# Patient Record
Sex: Female | Born: 2000 | Race: Black or African American | Hispanic: No | Marital: Single | Smoking: Never smoker
Health system: Southern US, Community
[De-identification: ages and names within clinical notes are randomized; demographics above are authoritative.]

---
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
## 2020-04-07 ENCOUNTER — Encounter: Payer: Self-pay | Admitting: Emergency Medicine

## 2020-04-07 ENCOUNTER — Other Ambulatory Visit: Payer: Self-pay

## 2020-04-07 ENCOUNTER — Emergency Department
Admission: EM | Admit: 2020-04-07 | Discharge: 2020-04-07 | Disposition: A | Discharge status: Home / Self Care | Payer: No Typology Code available for payment source | Attending: Emergency Medicine | Admitting: Emergency Medicine

## 2020-04-07 ENCOUNTER — Emergency Department: Payer: No Typology Code available for payment source

## 2020-04-07 DIAGNOSIS — R079 Chest pain, unspecified: Secondary | ICD-10-CM | POA: Diagnosis not present

## 2020-04-07 LAB — POCT PREGNANCY, URINE: Preg Test, Ur: NEGATIVE

## 2020-04-07 LAB — CBC
HCT: 40.7 % (ref 36.0–46.0)
Hemoglobin: 13.4 g/dL (ref 12.0–15.0)
MCH: 30.9 pg (ref 26.0–34.0)
MCHC: 32.9 g/dL (ref 30.0–36.0)
MCV: 93.8 fL (ref 80.0–100.0)
Platelets: 433 10*3/uL — ABNORMAL HIGH (ref 150–400)
RBC: 4.34 MIL/uL (ref 3.87–5.11)
RDW: 12.5 % (ref 11.5–15.5)
WBC: 6.4 10*3/uL (ref 4.0–10.5)
nRBC: 0 % (ref 0.0–0.2)

## 2020-04-07 LAB — BASIC METABOLIC PANEL
Anion gap: 8 (ref 5–15)
BUN: 8 mg/dL (ref 6–20)
CO2: 27 mmol/L (ref 22–32)
Calcium: 9.4 mg/dL (ref 8.9–10.3)
Chloride: 105 mmol/L (ref 98–111)
Creatinine, Ser: 0.59 mg/dL (ref 0.44–1.00)
GFR calc Af Amer: >60 mL/min (ref >60)
GFR calc non Af Amer: >60 mL/min (ref >60)
Glucose, Bld: 99 mg/dL (ref 70–99)
Potassium: 3.9 mmol/L (ref 3.5–5.1)
Sodium: 140 mmol/L (ref 135–145)

## 2020-04-07 LAB — TROPONIN I (HIGH SENSITIVITY): Troponin I (High Sensitivity): <2 ng/L (ref <18)

## 2020-04-07 MED ORDER — SODIUM CHLORIDE 0.9% FLUSH: 3.0000 mL | Freq: Once | INTRAVENOUS | Status: DC

## 2020-04-07 NOTE — ED Provider Notes (Signed)
[facility] Emergency Department Provider Note  Time seen: 10:04 AM  I have reviewed the triage vital signs and the nursing notes.   HISTORY  Chief Complaint Chest Pain   HPI Monica Hester is a 19 y.o. female with no past medical history presents to the emergency department for chest discomfort.  According to the patient on Thursday she lifted a heavy case of water and felt some chest discomfort.  States since then she has intermittently been experiencing pain in the chest.  Denies any shortness of breath or cough.  No chest pain currently.  No abdominal pain.  Largely negative review of systems.  Describes the pain as mild pulling type pain when it does occur.   History reviewed. No pertinent past medical history.  There are no problems to display for this patient.   History reviewed. No pertinent surgical history.  Prior to Admission medications   Medication Sig Start Date End Date Taking? Authorizing Provider  amoxicillin (AMOXIL) 875 MG tablet Take 1 tablet (875 mg total) by mouth 2 (two) times daily. Patient not taking: Reported on 09/04/2019 05/16/18   Faythe Ghee, PA-C    No Known Allergies  No family history on file.  Social History Social History   Tobacco Use  . Smoking status: Never Smoker  . Smokeless tobacco: Never Used  Substance Use Topics  . Alcohol use: No  . Drug use: No    Review of Systems Constitutional: Negative for fever. Cardiovascular: Intermittent chest pain over the past 3 days. Respiratory: Negative for shortness of breath.  Negative for cough. Gastrointestinal: Negative for abdominal pain Musculoskeletal: Negative for musculoskeletal complaints Neurological: Negative for headache All other ROS negative  ____________________________________________   PHYSICAL EXAM:  VITAL SIGNS: ED Triage Vitals  Enc Vitals Group     BP 04/07/20 0915 137/78     Pulse Rate 04/07/20 0915 90     Resp 04/07/20 0915 16    Temp 04/07/20 0915 98 F (36.7 C)     Temp Source 04/07/20 0915 Oral     SpO2 04/07/20 0915 99 %     Weight 04/07/20 0916 150 lb (68 kg)     Height 04/07/20 0916 5\' 5"  (1.651 m)     Head Circumference --      Peak Flow --      Pain Score 04/07/20 0916 5     Pain Loc --      Pain Edu? --      Excl. in GC? --    Constitutional: Alert and oriented. Well appearing and in no distress. Eyes: Normal exam ENT      Head: Normocephalic and atraumatic.      Mouth/Throat: Mucous membranes are moist. Cardiovascular: Normal rate, regular rhythm. No murmur Respiratory: Normal respiratory effort without tachypnea nor retractions. Breath sounds are clear Gastrointestinal: Soft and nontender. No distention.  Musculoskeletal: Nontender with normal range of motion in all extremities.  Neurologic:  Normal speech and language. No gross focal neurologic deficits Skin:  Skin is warm, dry and intact.  Psychiatric: Mood and affect are normal.  ____________________________________________    EKG  EKG viewed and interpreted by myself shows a normal sinus rhythm 85 bpm with a narrow QRS, normal axis, normal intervals, slight diffuse ST elevation most consistent with early repolarization.  No reciprocal depressions.  No concerning ST elevation.  ____________________________________________    RADIOLOGY  Chest x-ray negative  ____________________________________________   INITIAL IMPRESSION / ASSESSMENT AND PLAN / ED  COURSE  Pertinent labs & imaging results that were available during my care of the patient were reviewed by me and considered in my medical decision making (see chart for details).   Patient presents to the emergency department for chest pain intermittent over the past 3 days which occurred after lifting a case of water.  Highly suspect musculoskeletal pain/strain.  Patient's EKG is reassuring, chest x-ray on my evaluation appears clear awaiting radiology read.  We will check labs  including cardiac enzymes.  Overall patient appears well with a reassuring exam and reassuring vitals.  X-ray negative.  Labs are reassuring including negative troponin.  We will discharge patient with PCP follow-up.  Monica Hester was evaluated in Emergency Department on 04/07/2020 for the symptoms described in the history of present illness. She was evaluated in the context of the global COVID-19 pandemic, which necessitated consideration that the patient might be at risk for infection with the SARS-CoV-2 virus that causes COVID-19. Institutional protocols and algorithms that pertain to the evaluation of patients at risk for COVID-19 are in a state of rapid change based on information released by regulatory bodies including the CDC and federal and state organizations. These policies and algorithms were followed during the patient's care in the ED.  ____________________________________________   FINAL CLINICAL IMPRESSION(S) / ED DIAGNOSES  Chest pain   Harvest Dark, MD 04/07/20 1021

## 2020-04-07 NOTE — ED Triage Notes (Signed)
Pt to ED via POV c/o chest pressure since Thursday night. Pt states that earlier on Thursday she was carrying a heavy case of waters up the stairs. Pt reports pressure started later than night. Pt denies any other symptoms. Pt states that pain is worse when she moves a certain way or when she yawns. Pt is in NAD at this time.

## 2020-04-21 ENCOUNTER — Emergency Department
Admission: EM | Admit: 2020-04-21 | Discharge: 2020-04-21 | Disposition: A | Discharge status: Home / Self Care | Payer: No Typology Code available for payment source | Attending: Emergency Medicine | Admitting: Emergency Medicine

## 2020-04-21 ENCOUNTER — Encounter: Payer: Self-pay | Admitting: Emergency Medicine

## 2020-04-21 ENCOUNTER — Emergency Department: Payer: No Typology Code available for payment source

## 2020-04-21 ENCOUNTER — Other Ambulatory Visit: Payer: Self-pay

## 2020-04-21 DIAGNOSIS — W010XXA Fall on same level from slipping, tripping and stumbling without subsequent striking against object, initial encounter: Secondary | ICD-10-CM | POA: Diagnosis not present

## 2020-04-21 DIAGNOSIS — S80212A Abrasion, left knee, initial encounter: Secondary | ICD-10-CM | POA: Diagnosis not present

## 2020-04-21 DIAGNOSIS — Y929 Unspecified place or not applicable: Secondary | ICD-10-CM | POA: Insufficient documentation

## 2020-04-21 DIAGNOSIS — Y939 Activity, unspecified: Secondary | ICD-10-CM | POA: Diagnosis not present

## 2020-04-21 DIAGNOSIS — Y999 Unspecified external cause status: Secondary | ICD-10-CM | POA: Insufficient documentation

## 2020-04-21 DIAGNOSIS — S8002XA Contusion of left knee, initial encounter: Secondary | ICD-10-CM

## 2020-04-21 DIAGNOSIS — Z23 Encounter for immunization: Secondary | ICD-10-CM | POA: Diagnosis not present

## 2020-04-21 DIAGNOSIS — W19XXXA Unspecified fall, initial encounter: Secondary | ICD-10-CM

## 2020-04-21 MED ORDER — TETANUS-DIPHTH-ACELL PERTUSSIS 5-2.5-18.5 LF-MCG/0.5 IM SUSP
0.5000 mL | Freq: Once | INTRAMUSCULAR | Status: AC
  Administered 2020-04-21: 09:00:00 0.5 mL via INTRAMUSCULAR
  Filled 2020-04-21: qty 0.5

## 2020-04-21 NOTE — Discharge Instructions (Signed)
Follow-up with your primary care provider if any continued problems or concerns.  Tylenol or ibuprofen as needed for pain.  Ice and elevate to reduce any swelling and help with pain control.  Clean area daily with mild soap and water and watch for any signs of infection.

## 2020-04-21 NOTE — ED Provider Notes (Signed)
[facility] Emergency Department Provider Note   ____________________________________________   First MD Initiated Contact with Patient 04/21/20 [phone]     (approximate)  I have reviewed the triage vital signs and the nursing notes.   HISTORY  Chief Complaint Fall   HPI Monica Hester is a 19 y.o. female presents to the ED with complaint of left knee pain.  Patient fell on concrete last evening and has an abrasion to her knee.  Patient denies any head injury or loss of consciousness.  Her pain as 6 out of 10.     History reviewed. No pertinent past medical history.  There are no problems to display for this patient.   History reviewed. No pertinent surgical history.  Prior to Admission medications   Not on File    Allergies Patient has no known allergies.  History reviewed. No pertinent family history.  Social History Social History   Tobacco Use  . Smoking status: Never Smoker  . Smokeless tobacco: Never Used  Substance Use Topics  . Alcohol use: No  . Drug use: No    Review of Systems Constitutional: No fever/chills Eyes: No visual changes. ENT: No trauma. Cardiovascular: Denies chest pain. Respiratory: Denies shortness of breath. Gastrointestinal:   No nausea, no vomiting.  Musculoskeletal: Positive left knee pain. Skin: Positive abrasion. Neurological: Negative for headaches, focal weakness or numbness. ____________________________________________   PHYSICAL EXAM:  VITAL SIGNS: ED Triage Vitals  Enc Vitals Group     BP 04/21/20 0746 (!) 135/100     Pulse Rate 04/21/20 0746 91     Resp 04/21/20 0746 18     Temp 04/21/20 0746 98.4 F (36.9 C)     Temp Source 04/21/20 0746 Oral     SpO2 04/21/20 0746 96 %     Weight 04/21/20 0742 145 lb (65.8 kg)     Height 04/21/20 0742 5\' 5"  (1.651 m)     Head Circumference --      Peak Flow --      Pain Score 04/21/20 0741 6     Pain Loc --      Pain Edu? --      Excl. in GC?  --     Constitutional: Alert and oriented. Well appearing and in no acute distress. Eyes: Conjunctivae are normal.  Head: Atraumatic. Nose: No trauma. Neck: No stridor.   Cardiovascular: Normal rate, regular rhythm. Grossly normal heart sounds.  Good peripheral circulation. Respiratory: Normal respiratory effort.  No retractions. Lungs CTAB. Musculoskeletal: No gross deformities noted of the left knee.  There is soft tissue abrasion noted anteriorly without active bleeding or noted foreign body. Neurologic:  Normal speech and language. No gross focal neurologic deficits are appreciated.  Skin:  Skin is warm, dry.  Abrasion as noted above. Psychiatric: Mood and affect are normal. Speech and behavior are normal.  ____________________________________________   LABS (all labs ordered are listed, but only abnormal results are displayed)  Labs Reviewed - No data to display  RADIOLOGY  Official radiology report(s): DG Knee Complete 4 Views Left  Result Date: 04/21/2020 CLINICAL DATA:  Fall EXAM: LEFT KNEE - COMPLETE 4+ VIEW COMPARISON:  None. FINDINGS: Alignment is anatomic. No acute fracture. No joint effusion. Joint spaces are preserved. IMPRESSION: Negative. Electronically Signed   By: 06/21/2020 M.D.   On: 04/21/2020 08:51    ____________________________________________   PROCEDURES  Procedure(s) performed (including Critical Care):  Procedures  Area was cleaned and dressed by 06/21/2020,  RN. ____________________________________________   INITIAL IMPRESSION / ASSESSMENT AND PLAN / ED COURSE  As part of my medical decision making, I reviewed the following data within the electronic MEDICAL RECORD NUMBER Notes from prior ED visits and [place] Controlled Substance Database  KATHIA COVINGTON was evaluated in Emergency Department on 04/21/2020 for the symptoms described in the history of present illness. She was evaluated in the context of the global COVID-19 pandemic, which  necessitated consideration that the patient might be at risk for infection with the SARS-CoV-2 virus that causes COVID-19. Institutional protocols and algorithms that pertain to the evaluation of patients at risk for COVID-19 are in a state of rapid change based on information released by regulatory bodies including the CDC and federal and state organizations. These policies and algorithms were followed during the patient's care in the ED.  19 year old female was brought to the ED by mother with complaint of injury to her left leg last evening when she tripped and fell on concrete.  Tetanus was updated.  X-rays were negative for acute bony injury.  Patient was made aware that this will be sore and stiff.  She is to clean daily with mild soap and water and watch for any signs of infection.  Mother is aware that she may need Tylenol or ibuprofen as needed for pain.  Ice and elevation to reduce swelling and help with pain.  She is to follow-up with her PCP if any continued problems.  ____________________________________________   FINAL CLINICAL IMPRESSION(S) / ED DIAGNOSES  Final diagnoses:  Contusion of left knee, initial encounter  Abrasion, left knee, initial encounter  Fall, initial encounter     ED Discharge Orders    None       Note:  This document was prepared using Dragon voice recognition software and may include unintentional dictation errors.    Johnn Hai, PA-C 04/21/20 1605    Lavonia Drafts, MD 04/25/20 [phone]

## 2020-04-21 NOTE — ED Notes (Signed)
See triage note  Presents s/p trip and fall   Abrasion noted to left knee

## 2020-04-21 NOTE — ED Triage Notes (Signed)
Pt fell last night and cut left knee. Ambulatory. Band-Aid over knee. NAD

## 2020-05-04 ENCOUNTER — Encounter: Payer: Self-pay | Admitting: Emergency Medicine

## 2020-05-04 ENCOUNTER — Emergency Department
Admission: EM | Admit: 2020-05-04 | Discharge: 2020-05-04 | Disposition: A | Discharge status: Home / Self Care | Payer: No Typology Code available for payment source | Attending: Emergency Medicine | Admitting: Emergency Medicine

## 2020-05-04 ENCOUNTER — Other Ambulatory Visit: Payer: Self-pay

## 2020-05-04 DIAGNOSIS — W19XXXD Unspecified fall, subsequent encounter: Secondary | ICD-10-CM | POA: Insufficient documentation

## 2020-05-04 DIAGNOSIS — L089 Local infection of the skin and subcutaneous tissue, unspecified: Secondary | ICD-10-CM | POA: Diagnosis not present

## 2020-05-04 DIAGNOSIS — S81002D Unspecified open wound, left knee, subsequent encounter: Secondary | ICD-10-CM | POA: Diagnosis not present

## 2020-05-04 DIAGNOSIS — M25562 Pain in left knee: Secondary | ICD-10-CM | POA: Diagnosis present

## 2020-05-04 MED ORDER — CEPHALEXIN 500 MG PO CAPS: 500.0000 mg | ORAL_CAPSULE | Freq: Three times a day (TID) | ORAL | 0 refills | Status: DC

## 2020-05-04 NOTE — Discharge Instructions (Addendum)
Clean the area with soap and water only.  Do not use hydrogen peroxide on the wound.  You may apply a small amount of Neosporin daily if desired.  Take the antibiotic as prescribed.  If the infection is worsening please return emergency department.

## 2020-05-04 NOTE — ED Triage Notes (Signed)
Pt arrived via POV was seen 5/2 for knee abrasion, concerned that it is infected at this time, open area present Pt states she has been applying neosporin and spray peroxide to area as well.

## 2020-05-04 NOTE — ED Provider Notes (Signed)
[facility] Emergency Department Provider Note  ____________________________________________   First MD Initiated Contact with Patient 05/04/20 1123     (approximate)  I have reviewed the triage vital signs and the nursing notes.   HISTORY  Chief Complaint Knee Pain    HPI MANAL KREUTZER is a 19 y.o. female presents emergency department with mother.  Patient states she had a fall about 2 weeks ago and had blisters on the knee from the fall.  Area appears to be getting infected.  Mother is concerned that it can get worse.  She denies any fever or chills.  No pain at the site.  They have been using hydrogen peroxide to clean the area    History reviewed. No pertinent past medical history.  There are no problems to display for this patient.   History reviewed. No pertinent surgical history.  Prior to Admission medications   Medication Sig Start Date End Date Taking? Authorizing Provider  cephALEXin (KEFLEX) 500 MG capsule Take 1 capsule (500 mg total) by mouth 3 (three) times daily. 05/04/20   Versie Starks, PA-C    Allergies Patient has no known allergies.  History reviewed. No pertinent family history.  Social History Social History   Tobacco Use  . Smoking status: Never Smoker  . Smokeless tobacco: Never Used  Substance Use Topics  . Alcohol use: No  . Drug use: No    Review of Systems  Constitutional: No fever/chills Eyes: No visual changes. ENT: No sore throat. Respiratory: Denies cough Cardiovascular: Denies chest pain Gastrointestinal: Denies abdominal pain Genitourinary: Negative for dysuria. Musculoskeletal: Negative for back pain. Skin: Negative for rash.  Positive for open wound to the left knee Psychiatric: no mood changes,     ____________________________________________   PHYSICAL EXAM:  VITAL SIGNS: ED Triage Vitals  Enc Vitals Group     BP 05/04/20 1111 123/80     Pulse Rate 05/04/20 1111 86     Resp  05/04/20 1111 16     Temp 05/04/20 1111 98.3 F (36.8 C)     Temp Source 05/04/20 1111 Oral     SpO2 05/04/20 1111 100 %     Weight 05/04/20 1117 145 lb (65.8 kg)     Height 05/04/20 1117 5\' 5"  (1.651 m)     Head Circumference --      Peak Flow --      Pain Score 05/04/20 1117 4     Pain Loc --      Pain Edu? --      Excl. in [place]? --     Constitutional: Alert and oriented. Well appearing and in no acute distress. Eyes: Conjunctivae are normal.  Head: Atraumatic. Nose: No congestion/rhinnorhea. Mouth/Throat: Mucous membranes are moist.   Neck:  supple no lymphadenopathy noted Cardiovascular: Normal rate, regular rhythm. Respiratory: Normal respiratory effort.  No retractions,  GU: deferred Musculoskeletal: FROM all extremities, warm and well perfused, open wound noted to be partially scabbed over on the left knee, large fluid-filled blister noted inferior to the wound.  Area is slightly red and tender to touch Neurologic:  Normal speech and language.  Skin:  Skin is warm, dry and intact.  Redness noted below the left knee Psychiatric: Mood and affect are normal. Speech and behavior are normal.  ____________________________________________   LABS (all labs ordered are listed, but only abnormal results are displayed)  Labs Reviewed - No data to display ____________________________________________   ____________________________________________  RADIOLOGY    ____________________________________________  PROCEDURES  Procedure(s) performed: No  Procedures    ____________________________________________   INITIAL IMPRESSION / ASSESSMENT AND PLAN / ED COURSE  Pertinent labs & imaging results that were available during my care of the patient were reviewed by me and considered in my medical decision making (see chart for details).   Patient is a 19 year old female presents emergency department with mother with concerns of infection to the wound on her left knee.   See HPI.  Symptoms started a few days ago.  The injury happened 2 weeks ago.  Physical exam shows the knee to be nontender.  The wound below it is slightly tender.  Area has started to scab but there is also a fluid-filled blister inferior to the wound.  Area is very tender to palpation.  Wound culture obtained  I did explain the findings to the mother and the patient.  She was given a prescription for Keflex 500 3 times daily.  Follow-up with your regular doctor if not improving in 3 days.  Return emergency department worsening.  Also explained to them to stop using hydrogen peroxide on the open wound as this is eating white scar tissue.  They are to only use soap and water.  SHe states she understands will comply.  Child was discharged in stable condition.    ARIEONA SWAGGERTY was evaluated in Emergency Department on 05/04/2020 for the symptoms described in the history of present illness. She was evaluated in the context of the global COVID-19 pandemic, which necessitated consideration that the patient might be at risk for infection with the SARS-CoV-2 virus that causes COVID-19. Institutional protocols and algorithms that pertain to the evaluation of patients at risk for COVID-19 are in a state of rapid change based on information released by regulatory bodies including the CDC and federal and state organizations. These policies and algorithms were followed during the patient's care in the ED.   As part of my medical decision making, I reviewed the following data within the electronic MEDICAL RECORD NUMBER History obtained from family, Nursing notes reviewed and incorporated, Old chart reviewed, Notes from prior ED visits and [place] Controlled Substance Database  ____________________________________________   FINAL CLINICAL IMPRESSION(S) / ED DIAGNOSES  Final diagnoses:  Wound infection      NEW MEDICATIONS STARTED DURING THIS VISIT:  New Prescriptions   CEPHALEXIN (KEFLEX) 500 MG CAPSULE    Take  1 capsule (500 mg total) by mouth 3 (three) times daily.     Note:  This document was prepared using Dragon voice recognition software and may include unintentional dictation errors.    Faythe Ghee, PA-C 05/04/20 1143    Jene Every, MD 05/04/20 1144

## 2020-05-04 NOTE — ED Notes (Signed)
laft knee pain, reports s/p fall 2 weeks ago and had 2 blisters on her knee from fall. One erupted on its own. Other still there and now painful. Denies fever. Awaiting eval and plan of care.

## 2020-05-06 LAB — AEROBIC CULTURE W GRAM STAIN (SUPERFICIAL SPECIMEN): Gram Stain: NONE SEEN

## 2020-07-11 IMAGING — DX DG KNEE COMPLETE 4+V*L*
4 series · 4 of 4 positions shown · non-contrast
Comparison: None.

CLINICAL DATA: Fall

EXAM:
LEFT KNEE - COMPLETE 4+ VIEW

[knee ap]
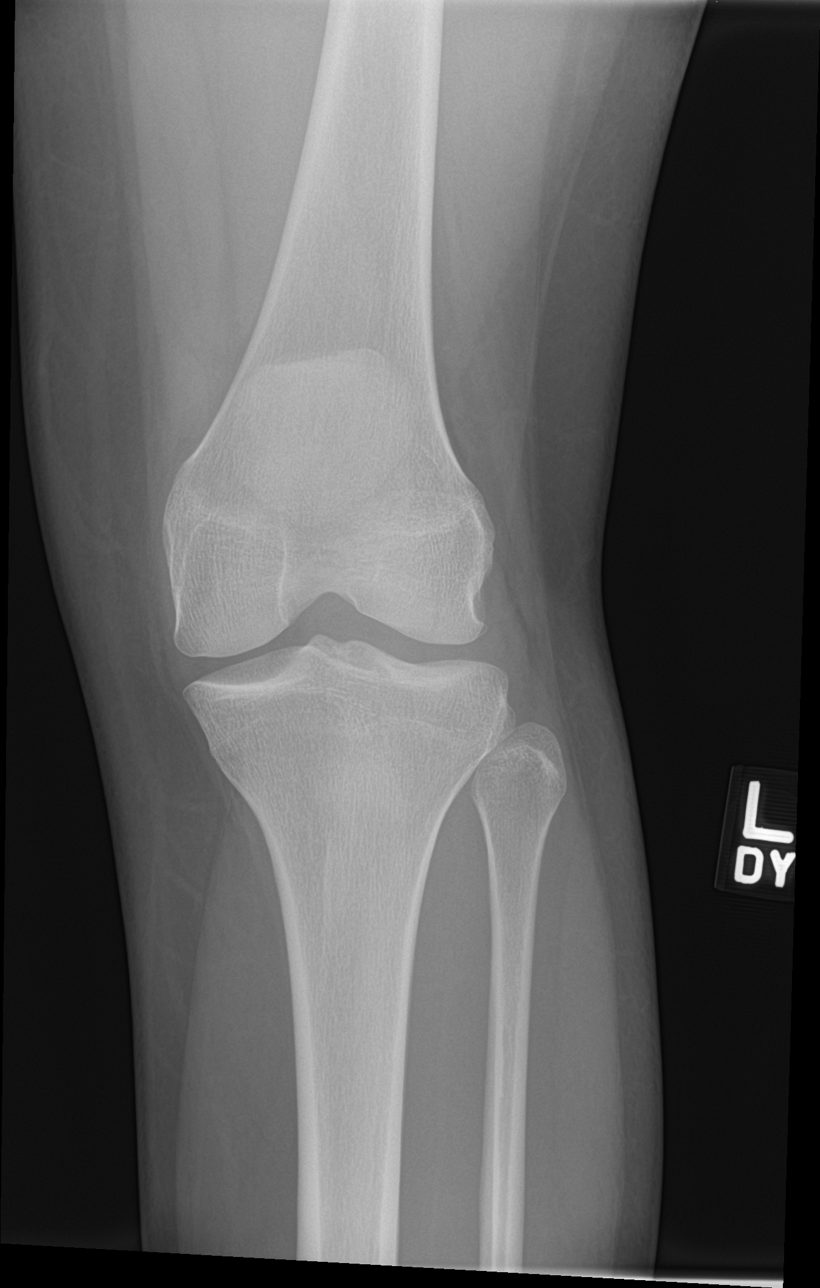

[knee obl (1 of 2)]
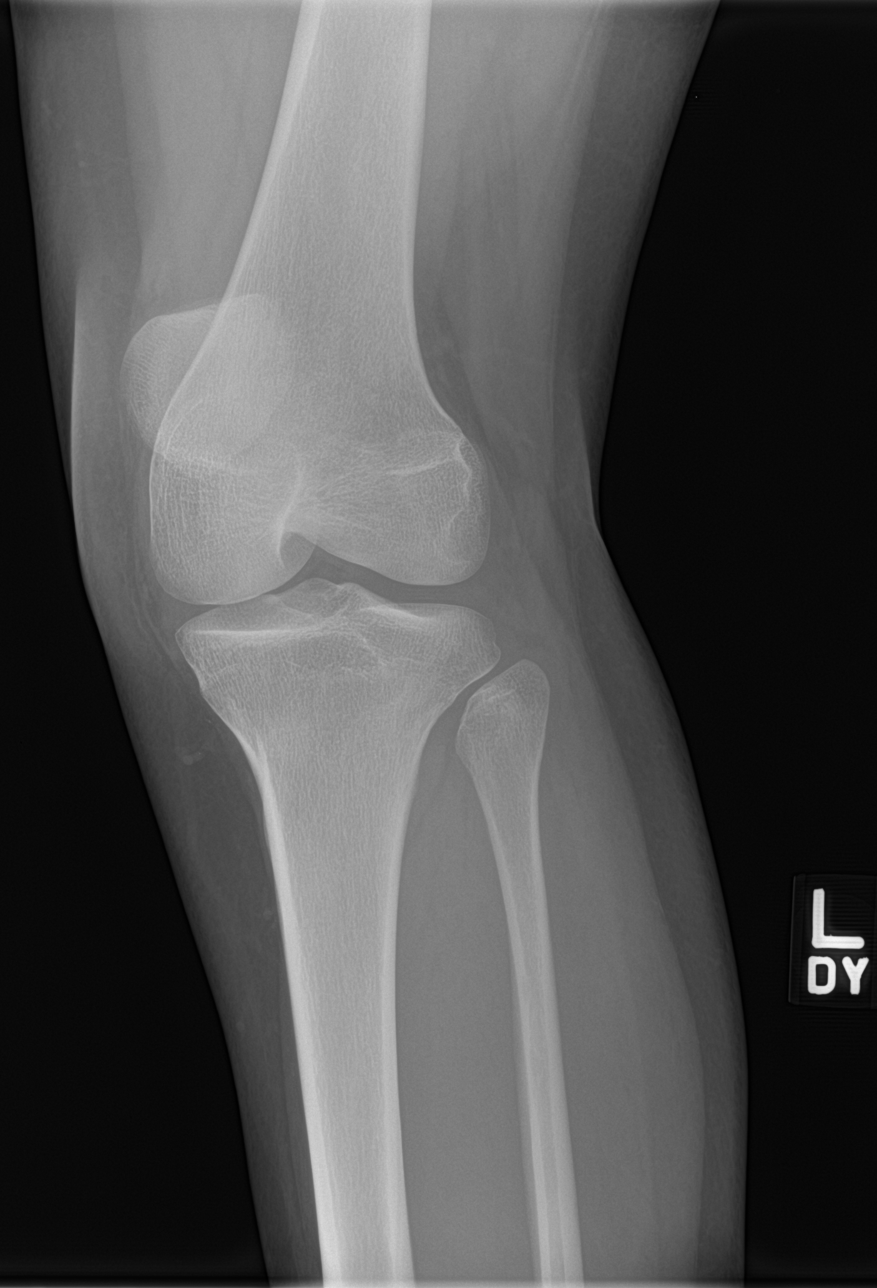

[knee lat]
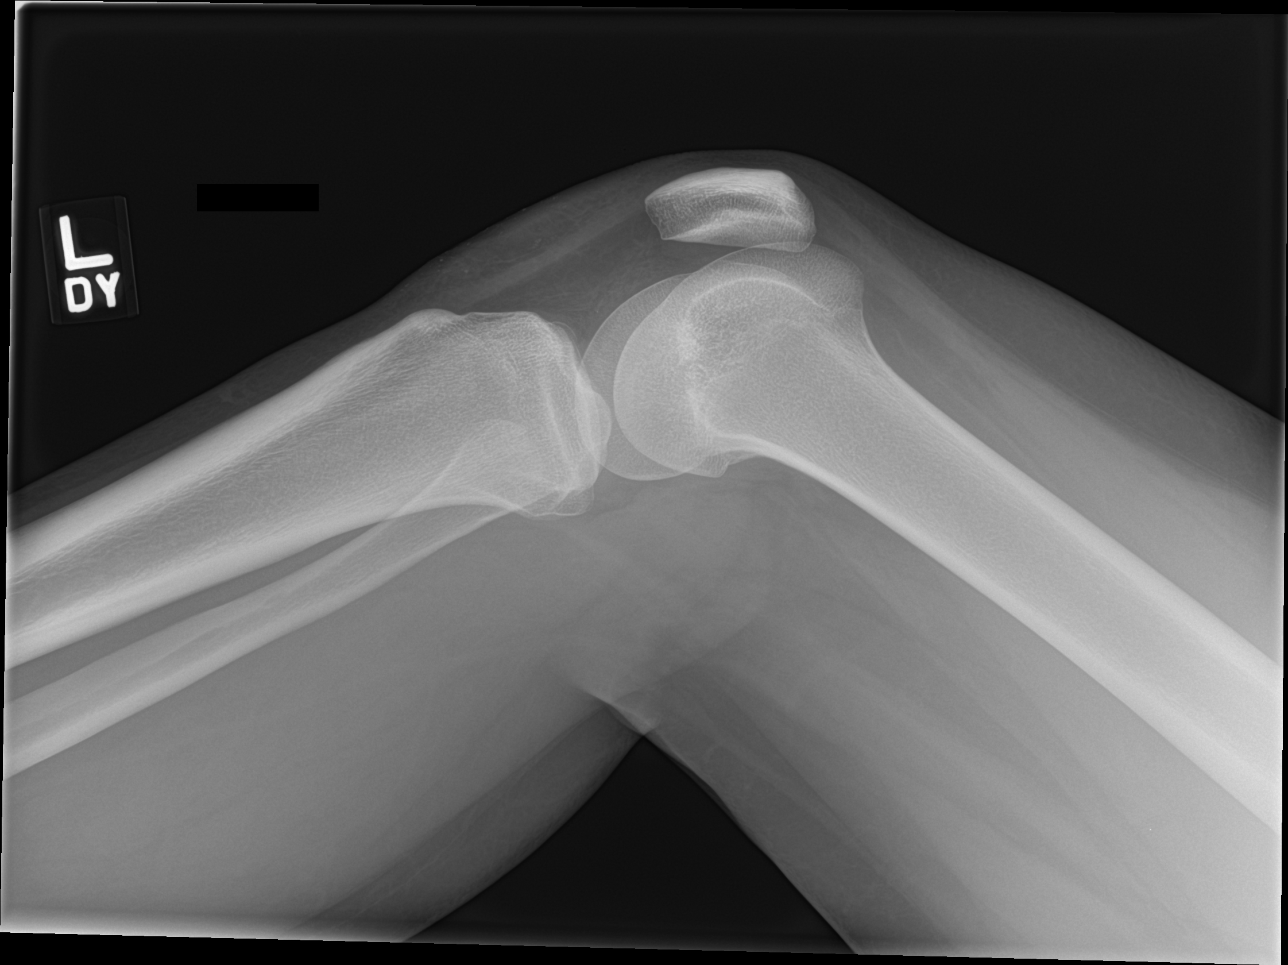

[knee obl (2 of 2)]
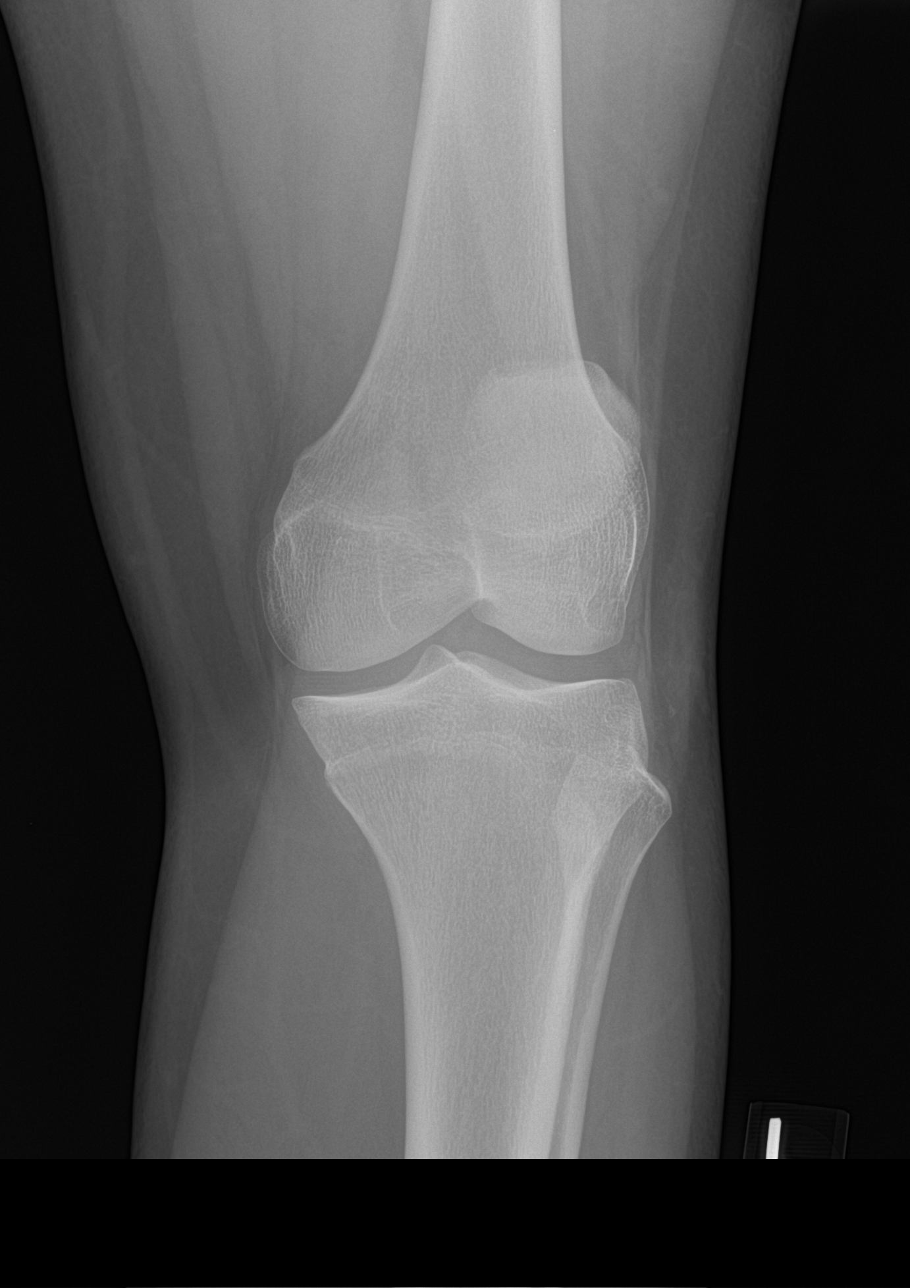

[4 of 4 positions shown; findings below may reference images not displayed]

FINDINGS: Alignment is anatomic. No acute fracture. No joint effusion. Joint
spaces are preserved.
IMPRESSION: Negative.

## 2021-09-15 ENCOUNTER — Other Ambulatory Visit: Payer: Self-pay

## 2021-09-15 ENCOUNTER — Encounter: Payer: Self-pay | Admitting: *Deleted

## 2021-09-15 DIAGNOSIS — J020 Streptococcal pharyngitis: Secondary | ICD-10-CM | POA: Insufficient documentation

## 2021-09-15 NOTE — ED Triage Notes (Signed)
Pt has a sore throat.  No ear ache.  No fever   pt alert  spee clear

## 2021-09-16 ENCOUNTER — Emergency Department
Admission: EM | Admit: 2021-09-16 | Discharge: 2021-09-16 | Disposition: A | Discharge status: Home / Self Care | Payer: No Typology Code available for payment source | Attending: Emergency Medicine | Admitting: Emergency Medicine

## 2021-09-16 DIAGNOSIS — J029 Acute pharyngitis, unspecified: Secondary | ICD-10-CM

## 2021-09-16 LAB — GROUP A STREP BY PCR: Group A Strep by PCR: DETECTED — AB

## 2021-09-16 MED ORDER — AMOXICILLIN 500 MG PO CAPS: 500.0000 mg | ORAL_CAPSULE | Freq: Two times a day (BID) | ORAL | 0 refills | Status: AC

## 2021-09-16 MED ORDER — IBUPROFEN 400 MG PO TABS
600.0000 mg | ORAL_TABLET | Freq: Once | ORAL | Status: AC
  Administered 2021-09-16: 600 mg via ORAL
  Filled 2021-09-16: qty 2

## 2021-09-16 MED ORDER — AMOXICILLIN 500 MG PO CAPS
500.0000 mg | ORAL_CAPSULE | Freq: Once | ORAL | Status: AC
  Administered 2021-09-16: 500 mg via ORAL
  Filled 2021-09-16: qty 1

## 2021-09-16 NOTE — ED Notes (Signed)
Dr Don Perking to triage to evaluate & d/c pt

## 2021-09-16 NOTE — ED Provider Notes (Signed)
[facility] Emergency Department Provider Note  ____________________________________________  Time seen: Approximately 1:31 AM  I have reviewed the triage vital signs and the nursing notes.   HISTORY  Chief Complaint Sore Throat   HPI Monica Hester is a 20 y.o. female significant past medical history who presents for evaluation of a sore throat.  She reports that her symptoms started 2 days ago.  She is complaining of bilateral pain worse with swallowing, constant for 2 days.  No fever or chills, no cough or congestion, no nausea or vomiting, no diarrhea.  No past medical history on file.  There are no problems to display for this patient.   No past surgical history on file.  Prior to Admission medications   Medication Sig Start Date End Date Taking? Authorizing Provider  amoxicillin (AMOXIL) 500 MG capsule Take 1 capsule (500 mg total) by mouth 2 (two) times daily for 10 days. 09/16/21 09/26/21 Yes Nita Sickle, MD    Allergies Patient has no known allergies.  No family history on file.  Social History Social History   Tobacco Use   Smoking status: Never   Smokeless tobacco: Never  Substance Use Topics   Alcohol use: No   Drug use: No    Review of Systems  Constitutional: Negative for fever. Eyes: Negative for visual changes. ENT: + sore throat. Neck: No neck pain  Cardiovascular: Negative for chest pain. Respiratory: Negative for shortness of breath. Gastrointestinal: Negative for abdominal pain, vomiting or diarrhea. Genitourinary: Negative for dysuria. Musculoskeletal: Negative for back pain. Skin: Negative for rash. Neurological: Negative for headaches, weakness or numbness. Psych: No SI or HI  ____________________________________________   PHYSICAL EXAM:  VITAL SIGNS: ED Triage Vitals  Enc Vitals Group     BP 09/15/21 2106 (!) 131/94     Pulse Rate 09/15/21 2106 (!) 105     Resp 09/15/21 2106 16     Temp  09/15/21 2106 98.4 F (36.9 C)     Temp Source 09/15/21 2106 Oral     SpO2 09/15/21 2106 98 %     Weight 09/15/21 2108 130 lb (59 kg)     Height 09/15/21 2108 5\' 6"  (1.676 m)     Head Circumference --      Peak Flow --      Pain Score 09/15/21 2107 8     Pain Loc --      Pain Edu? --      Excl. in [place]? --     Constitutional: Alert and oriented. Well appearing and in no apparent distress. HEENT:      Head: Normocephalic and atraumatic.         Eyes: Conjunctivae are normal. Sclera is non-icteric.       Mouth/Throat: Mucous membranes are moist.  Bilateral tonsils are swollen, erythematous with mild exudate, no peritonsillar abscess      Neck: Supple with no signs of meningismus.  Shotty tender bilateral cervical adenopathy Cardiovascular: Regular rate and rhythm.  Respiratory: Normal respiratory effort.  \Gastrointestinal: Soft, non tender. Musculoskeletal:  No edema, cyanosis, or erythema of extremities. Neurologic: Normal speech and language. Face is symmetric. Moving all extremities. No gross focal neurologic deficits are appreciated. Skin: Skin is warm, dry and intact. No rash noted. Psychiatric: Mood and affect are normal. Speech and behavior are normal.  ____________________________________________   LABS (all labs ordered are listed, but only abnormal results are displayed)  Labs Reviewed  GROUP A STREP BY PCR   ____________________________________________  EKG  none  ____________________________________________  RADIOLOGY  none  ____________________________________________   PROCEDURES  Procedure(s) performed: None Procedures   Critical Care performed:  None ____________________________________________   INITIAL IMPRESSION / ASSESSMENT AND PLAN / ED COURSE  20 y.o. female significant past medical history who presents for evaluation of a sore throat.  Patient with sore throat, no cough, swelling and exudate on bilateral tonsils and shotty cervical  lymphadenopathy.  Strep swab was sent but patient will be started on amoxicillin twice daily.  Ibuprofen as an anti-inflammatory.  Discussed gargling with warm water and salt and follow-up with PCP.  Discussed my standard return precautions for fever or worsening sore throat.      _____________________________________________ Please note:  Patient was evaluated in Emergency Department today for the symptoms described in the history of present illness. Patient was evaluated in the context of the global COVID-19 pandemic, which necessitated consideration that the patient might be at risk for infection with the SARS-CoV-2 virus that causes COVID-19. Institutional protocols and algorithms that pertain to the evaluation of patients at risk for COVID-19 are in a state of rapid change based on information released by regulatory bodies including the CDC and federal and state organizations. These policies and algorithms were followed during the patient's care in the ED.  Some ED evaluations and interventions may be delayed as a result of limited staffing during the pandemic.   [place] Controlled Substance Database was reviewed by me. ____________________________________________   FINAL CLINICAL IMPRESSION(S) / ED DIAGNOSES   Final diagnoses:  Pharyngitis, unspecified etiology      NEW MEDICATIONS STARTED DURING THIS VISIT:  ED Discharge Orders          Ordered    amoxicillin (AMOXIL) 500 MG capsule  2 times daily        09/16/21 0132             Note:  This document was prepared using Dragon voice recognition software and may include unintentional dictation errors.    Don Perking, [place], MD 09/16/21 [phone]

## 2022-07-20 ENCOUNTER — Encounter: Payer: Self-pay | Admitting: Emergency Medicine

## 2022-07-20 ENCOUNTER — Other Ambulatory Visit: Payer: Self-pay

## 2022-07-20 ENCOUNTER — Emergency Department
Admission: EM | Admit: 2022-07-20 | Discharge: 2022-07-20 | Disposition: A | Discharge status: Home / Self Care | Payer: Medicaid Other | Attending: Emergency Medicine | Admitting: Emergency Medicine

## 2022-07-20 DIAGNOSIS — Z20822 Contact with and (suspected) exposure to covid-19: Secondary | ICD-10-CM | POA: Diagnosis not present

## 2022-07-20 DIAGNOSIS — J029 Acute pharyngitis, unspecified: Secondary | ICD-10-CM | POA: Diagnosis present

## 2022-07-20 DIAGNOSIS — J020 Streptococcal pharyngitis: Secondary | ICD-10-CM | POA: Diagnosis not present

## 2022-07-20 LAB — RESP PANEL BY RT-PCR (FLU A&B, COVID) ARPGX2
Influenza A by PCR: NEGATIVE
Influenza B by PCR: NEGATIVE
SARS Coronavirus 2 by RT PCR: NEGATIVE

## 2022-07-20 MED ORDER — CEPHALEXIN 500 MG PO CAPS: 500.0000 mg | ORAL_CAPSULE | Freq: Three times a day (TID) | ORAL | 0 refills | Status: AC

## 2022-07-20 NOTE — ED Provider Notes (Signed)
   [facility] Provider Note    Event Date/Time   First MD Initiated Contact with Patient 07/20/22 [phone]     (approximate)  History   Chief Complaint: Sore Throat  HPI  Monica Hester is a 21 y.o. female with no significant past medical history presents to the emergency department for 1 week of sore throat.  According to the patient for the past 1 week she has had painful swallowing and a sore throat.  She states initially she had a runny nose but that is largely resolved.  No cough.  No fever.  Physical Exam   Triage Vital Signs: ED Triage Vitals  Enc Vitals Group     BP 07/20/22 0720 122/85     Pulse Rate 07/20/22 0720 97     Resp 07/20/22 0720 16     Temp 07/20/22 0720 99.2 F (37.3 C)     Temp Source 07/20/22 0720 Oral     SpO2 07/20/22 0720 97 %     Weight 07/20/22 0725 130 lb 1.1 oz (59 kg)     Height 07/20/22 0725 5\' 6"  (1.676 m)     Head Circumference --      Peak Flow --      Pain Score 07/20/22 0716 8     Pain Loc --      Pain Edu? --      Excl. in [place]? --     Most recent vital signs: Vitals:   07/20/22 0720  BP: 122/85  Pulse: 97  Resp: 16  Temp: 99.2 F (37.3 C)  SpO2: 97%    General: Awake, no distress.  CV:  Good peripheral perfusion.  Regular rate and rhythm  Resp:  Normal effort.  Equal breath sounds bilaterally.  Abd:  No distention.  Soft, nontender.  No rebound or guarding. Other:  Patient has pharyngeal erythema bilaterally with mild amount of tonsillar exudates bilaterally.  There is no significant tonsillar perjury no uvular deviation.   ED Results / Procedures / Treatments   MEDICATIONS ORDERED IN ED: Medications - No data to display   IMPRESSION / MDM / ASSESSMENT AND PLAN / ED COURSE  I reviewed the triage vital signs and the nursing notes.  Patient's presentation is most consistent with acute illness / injury with system symptoms.  Patient presents to the emergency department with 1 week of sore throat.   On examination patient has erythema of the pharynx and tonsils with tonsillar exudates, lack of cough most consistent with strep infection.  We will test for COVID as a precaution.  We will cover with antibiotics.  Patient agreeable to plan of care  FINAL CLINICAL IMPRESSION(S) / ED DIAGNOSES   Strep pharyngitis  Rx / DC Orders   Keflex x7 days  Note:  This document was prepared using Dragon voice recognition software and may include unintentional dictation errors.   07/22/22, MD 07/20/22 [phone]

## 2022-07-20 NOTE — ED Triage Notes (Signed)
Sore throat one week.  Painful to swallow
